# Patient Record
Sex: Female | Born: 1995 | Race: Black or African American | Hispanic: No | Marital: Single | Smoking: Never smoker
Health system: Southern US, Community
[De-identification: ages and names within clinical notes are randomized; demographics above are authoritative.]

## PROBLEM LIST (undated history)

## (undated) DIAGNOSIS — J45909 Unspecified asthma, uncomplicated: Secondary | ICD-10-CM

---

## 2018-02-14 ENCOUNTER — Encounter (HOSPITAL_COMMUNITY): Payer: Self-pay | Admitting: Emergency Medicine

## 2018-02-14 ENCOUNTER — Emergency Department (HOSPITAL_COMMUNITY)

## 2018-02-14 ENCOUNTER — Emergency Department (HOSPITAL_COMMUNITY)
Admission: EM | Admit: 2018-02-14 | Discharge: 2018-02-14 | Disposition: A | Discharge status: Home / Self Care | Attending: Emergency Medicine | Admitting: Emergency Medicine

## 2018-02-14 ENCOUNTER — Other Ambulatory Visit: Payer: Self-pay

## 2018-02-14 DIAGNOSIS — Y9344 Activity, trampolining: Secondary | ICD-10-CM | POA: Diagnosis not present

## 2018-02-14 DIAGNOSIS — Y9289 Other specified places as the place of occurrence of the external cause: Secondary | ICD-10-CM | POA: Insufficient documentation

## 2018-02-14 DIAGNOSIS — Y33XXXA Other specified events, undetermined intent, initial encounter: Secondary | ICD-10-CM | POA: Diagnosis not present

## 2018-02-14 DIAGNOSIS — J45909 Unspecified asthma, uncomplicated: Secondary | ICD-10-CM | POA: Diagnosis not present

## 2018-02-14 DIAGNOSIS — S83422A Sprain of lateral collateral ligament of left knee, initial encounter: Secondary | ICD-10-CM | POA: Diagnosis not present

## 2018-02-14 DIAGNOSIS — Y998 Other external cause status: Secondary | ICD-10-CM | POA: Diagnosis not present

## 2018-02-14 DIAGNOSIS — S8991XA Unspecified injury of right lower leg, initial encounter: Secondary | ICD-10-CM | POA: Diagnosis present

## 2018-02-14 HISTORY — DX: Unspecified asthma, uncomplicated: J45.909

## 2018-02-14 MED ORDER — MELOXICAM 15 MG PO TABS: 15.0000 mg | ORAL_TABLET | Freq: Every day | ORAL | 0 refills | Status: AC | PRN

## 2018-02-14 NOTE — ED Notes (Signed)
Took 200 mg Ibuprofen x10 minutes ago.

## 2018-02-14 NOTE — Discharge Instructions (Addendum)
Contact a health care provider if: You continue to have pain and swelling for 2-4 weeks. Your knee feels unstable or gives way.

## 2018-02-14 NOTE — ED Notes (Signed)
Ice pack given to patient.

## 2018-02-14 NOTE — ED Provider Notes (Signed)
[facility]-EMERGENCY DEPT Provider Note   CSN: [id number] Arrival date & time: 02/14/18  1947     History   Chief Complaint Chief Complaint  Patient presents with  . Knee Injury    HPI Natalie Chang is a 22 y.o. female.  Who presents the emergency department chief complaint of knee injury.  Patient was at the trampoline park when she came down onto her left leg.  She felt her knee pop to the side laterally and heard a popping sound.  She had immediate severe pain and was unable to bear weight on the leg.  She denies any numbness or tingling.  She feels pain even when she lifts her leg at the hip and has dead weight on the knee.  She is no previous history of injuries to the left knee.  She has no other injuries.  She did not hit her head or lose consciousness  HPI  Past Medical History:  Diagnosis Date  . Asthma     There are no active problems to display for this patient.   History reviewed. No pertinent surgical history.   OB History   None      Home Medications    Prior to Admission medications   Not on File    Family History History reviewed. No pertinent family history.  Social History Social History   Tobacco Use  . Smoking status: Never Smoker  . Smokeless tobacco: Never Used  Substance Use Topics  . Alcohol use: Not Currently  . Drug use: Not Currently     Allergies   Patient has no known allergies.   Review of Systems Review of Systems Ten systems reviewed and are negative for acute change, except as noted in the HPI.    Physical Exam Updated Vital Signs BP (!) 138/95 (BP Location: Right Arm)   Pulse 90   Temp 99.1 F (37.3 C) (Oral)   Resp 16   Ht 5\' 8"  (1.727 m)   Wt 94.3 kg   LMP 02/07/2018 (Exact Date)   SpO2 100%   BMI 31.63 kg/m   Physical Exam  Constitutional: She is oriented to person, place, and time. She appears well-developed and well-nourished. No distress.  HENT:  Head: Normocephalic and  atraumatic.  Eyes: Conjunctivae are normal. No scleral icterus.  Neck: Normal range of motion.  Cardiovascular: Normal rate, regular rhythm and normal heart sounds. Exam reveals no gallop and no friction rub.  No murmur heard. Pulmonary/Chest: Effort normal and breath sounds normal. No respiratory distress.  Abdominal: Soft. Bowel sounds are normal. She exhibits no distension and no mass. There is no tenderness. There is no guarding.  Musculoskeletal:  Left knee with minimal swelling, tender palpation along the lateral collateral ligament.  She has some mild opening of the joint on the lateral side.  The rest of the ligaments appear stable with negative anterior posterior tears drawer sign.  No medial joint line tenderness.  Patient has minimal range of motion of the knee with pain.  Normal pulses in the bilateral lower extremities, normal ipsilateral hip and ankle examination.  Neurological: She is alert and oriented to person, place, and time.  Skin: Skin is warm and dry. She is not diaphoretic.  Psychiatric: Her behavior is normal.  Nursing note and vitals reviewed.    ED Treatments / Results  Labs (all labs ordered are listed, but only abnormal results are displayed) Labs Reviewed - No data to display  EKG None  Radiology Dg  Knee Complete 4 Views Left  Result Date: 02/14/2018 CLINICAL DATA:  Injury EXAM: LEFT KNEE - COMPLETE 4+ VIEW COMPARISON:  None. FINDINGS: No evidence of fracture, dislocation, or joint effusion. No evidence of arthropathy or other focal bone abnormality. Soft tissues are unremarkable. IMPRESSION: Negative. Electronically Signed   By: Jasmine PangKim  Fujinaga M.D.   On: 02/14/2018 20:29    Procedures Procedures (including critical care time)  Medications Ordered in ED Medications - No data to display   Initial Impression / Assessment and Plan / ED Course  I have reviewed the triage vital signs and the nursing notes.  Pertinent labs & imaging results that were  available during my care of the patient were reviewed by me and considered in my medical decision making (see chart for details).     Patient X-Ray negative for obvious fracture or dislocation. Pain managed in ED. Pt advised to follow up with orthopedics if symptoms persist for possibility of missed fracture diagnosis. Patient given brace while in ED, conservative therapy recommended and discussed. Patient will be dc home & is agreeable with above plan.   Final Clinical Impressions(s) / ED Diagnoses   Final diagnoses:  Sprain of lateral collateral ligament of left knee, initial encounter    ED Discharge Orders    None       Arthor CaptainHarris, Maurico Perrell, PA-C 02/14/18 2136    Loren RacerYelverton, David, MD 02/14/18 2231

## 2018-02-14 NOTE — ED Triage Notes (Signed)
Pt arriving POV with left knee injury. Pt reports she was at the trampoline park felt her left knee "snap" and then she fell down.

## 2018-02-14 NOTE — ED Notes (Signed)
AVS explained in detail. School note given and note to address patient's transportation needs from class to class. Knows to take medication as prescribed and rest/ice/elevate knee. Knows appropriate use of knee immobilizer. In place upon discharge. Taken in wheelchair to care. No other c/c. Given verbal consent to discharge instead of signing.

## 2018-02-21 ENCOUNTER — Ambulatory Visit (INDEPENDENT_AMBULATORY_CARE_PROVIDER_SITE_OTHER): Admitting: Orthopaedic Surgery

## 2018-02-21 ENCOUNTER — Encounter (INDEPENDENT_AMBULATORY_CARE_PROVIDER_SITE_OTHER): Payer: Self-pay | Admitting: Orthopaedic Surgery

## 2018-02-21 VITALS — BP 119/73 | HR 60 | Ht 68.0 in | Wt 208.0 lb

## 2018-02-21 DIAGNOSIS — M25062 Hemarthrosis, left knee: Secondary | ICD-10-CM | POA: Diagnosis not present

## 2018-02-21 DIAGNOSIS — S8392XA Sprain of unspecified site of left knee, initial encounter: Secondary | ICD-10-CM

## 2018-02-21 MED ORDER — LIDOCAINE HCL 1 % IJ SOLN
0.5000 mL | INTRAMUSCULAR | Status: AC | PRN
  Administered 2018-02-21: .5 mL

## 2018-02-21 NOTE — Addendum Note (Signed)
Addended by: Rogers SeedsYEATTS, Emer Onnen M on: 02/21/2018 04:54 PM   Modules accepted: Orders

## 2018-02-21 NOTE — Progress Notes (Addendum)
Office Visit Note   Patient: Natalie Chang           Date of Birth: 1995/08/31           MRN: [id number] Visit Date: 02/21/2018              Requested by: No referring provider defined for this encounter. PCP: Patient, No Pcp Per   Assessment & Plan: Visit Diagnoses:  1. Sprain of left knee, unspecified ligament, initial encounter   2. Knee hemarthrosis, left     Plan: Patient has had acute hemarthrosis after he knee injury.  She is been on crutches and use the knee immobilizer.  She needs MRI scan to rule out acute ACL tear versus peripheral meniscal tear with her acute hemarthrosis.  Office follow-up after scan for review. Follow-Up Instructions: Office follow-up post MRI left knee for acute knee injury with tense hemarthrosis.  Compressive knee sleeve given that she can use when she is out of the knee immobilizer.  Orders:  Orders Placed This Encounter  Procedures  . Large Joint Inj   No orders of the defined types were placed in this encounter.     Procedures: Large Joint Inj: L knee on 02/21/2018 3:21 PM Indications: joint swelling and pain Details: 22 G 1.5 in needle, anterolateral approach  Arthrogram: No  Medications: 0.5 mL lidocaine 1 % Aspirate: 40 mL bloody Outcome: tolerated well, no immediate complications Procedure, treatment alternatives, risks and benefits explained, specific risks discussed. Consent was given by the patient. Patient was prepped and draped in the usual sterile fashion.       Clinical Data: No additional findings.   Subjective: Chief Complaint  Patient presents with  . Left Knee - Pain    HPI 22 year old female was at a trampoline park when she injured her left leg.  States she had sharp pain laterally heard a pop and was not able to bear weight.  She is seen in the emergency room on 02/14/2018 where x-rays of her knee were negative for effusion or fracture.  Diagnosis was suspected sprain of the lateral collateral ligament of  the left knee.  No past history of knee injury.  Patient's mother is on face time on patient's phone giving additional history.  Patient is a Holiday representative at [employer].  Review of Systems positive for asthma.  Patient is a non-smoker.  14 point review of systems noncontributory as it pertains HPI 14 systems updated.   Objective: Vital Signs: BP 119/73   Pulse 60   Ht 5\' 8"  (1.727 m)   Wt 208 lb (94.3 kg)   LMP 02/07/2018 (Exact Date)   BMI 31.63 kg/m   Physical Exam  Constitutional: She is oriented to person, place, and time. She appears well-developed.  HENT:  Head: Normocephalic.  Right Ear: External ear normal.  Left Ear: External ear normal.  Eyes: Pupils are equal, round, and reactive to light.  Neck: No tracheal deviation present. No thyromegaly present.  Cardiovascular: Normal rate.  Pulmonary/Chest: Effort normal.  Abdominal: Soft.  Neurological: She is alert and oriented to person, place, and time.  Skin: Skin is warm and dry.  Psychiatric: She has a normal mood and affect. Her behavior is normal.    Ortho Exam patient has significant swelling left knee with likely hemarthrosis.  Collateral ligaments are stable she has some tenderness along the medial collateral ligament and over the MCL attachment of the femur without significant opening with valgus stress.  Some tenderness  at the lateral joint line 1+ anterior Lockman.  She can flex to 60 degrees limited by her hemarthrosis.  Patellar tracking in that range is normal no tenderness over the medial patellofemoral ligament.  Distal pulses are intact no ecchymosis is noted.   Specialty Comments:  No specialty comments available.  Imaging: No results found.   PMFS History: There are no active problems to display for this patient.  Past Medical History:  Diagnosis Date  . Asthma     No family history on file.  No past surgical history on file. Social History   Occupational History  . Not on file    Tobacco Use  . Smoking status: Never Smoker  . Smokeless tobacco: Never Used  Substance and Sexual Activity  . Alcohol use: Not Currently  . Drug use: Not Currently  . Sexual activity: Not on file

## 2018-02-22 ENCOUNTER — Telehealth (INDEPENDENT_AMBULATORY_CARE_PROVIDER_SITE_OTHER): Payer: Self-pay | Admitting: Orthopaedic Surgery

## 2018-02-22 NOTE — Telephone Encounter (Signed)
Ok for note 

## 2018-02-22 NOTE — Telephone Encounter (Signed)
Note emailed. Patient advised.

## 2018-02-22 NOTE — Telephone Encounter (Signed)
Yes OK. thanks

## 2018-02-22 NOTE — Telephone Encounter (Signed)
Patient called t get a note to excuse her from class today. She came in yesterday and had her knee aspirated  Today it is very swollen and she can hardly walk on it.  Please call patient to advise.  [phone]

## 2018-02-22 NOTE — Telephone Encounter (Signed)
Patient states shes already missed a couple out of the 5 classes she had today, she is wondering when she could expect this note if its approved? She is needing it as soon as possible and she would like to email her professors the note from Dr. Ophelia CharterYates so is requesting it be emailed to her at:  [email or web address]

## 2018-02-24 ENCOUNTER — Telehealth (INDEPENDENT_AMBULATORY_CARE_PROVIDER_SITE_OTHER): Payer: Self-pay | Admitting: Radiology

## 2018-02-24 NOTE — Telephone Encounter (Signed)
error 

## 2018-03-12 ENCOUNTER — Ambulatory Visit
Admission: RE | Admit: 2018-03-12 | Discharge: 2018-03-12 | Disposition: A | Discharge status: Home / Self Care | Source: Ambulatory Visit | Attending: Orthopaedic Surgery | Admitting: Orthopaedic Surgery

## 2018-03-12 DIAGNOSIS — S8392XA Sprain of unspecified site of left knee, initial encounter: Secondary | ICD-10-CM

## 2018-03-12 DIAGNOSIS — M25062 Hemarthrosis, left knee: Secondary | ICD-10-CM

## 2018-03-14 ENCOUNTER — Ambulatory Visit (INDEPENDENT_AMBULATORY_CARE_PROVIDER_SITE_OTHER): Admitting: Orthopaedic Surgery

## 2018-03-14 ENCOUNTER — Encounter (INDEPENDENT_AMBULATORY_CARE_PROVIDER_SITE_OTHER): Payer: Self-pay | Admitting: Orthopaedic Surgery

## 2018-03-14 VITALS — BP 116/68 | HR 68 | Ht 68.0 in | Wt 208.0 lb

## 2018-03-14 DIAGNOSIS — S83512D Sprain of anterior cruciate ligament of left knee, subsequent encounter: Secondary | ICD-10-CM | POA: Diagnosis not present

## 2018-03-14 NOTE — Progress Notes (Signed)
Office Visit Note   Patient: Natalie SleightBrionna Keahey           Date of Birth: 11/24/1995           MRN: [id number] Visit Date: 03/14/2018              Requested by: No referring provider defined for this encounter. PCP: Patient, No Pcp Per   Assessment & Plan: Visit Diagnoses:  1. Complete tear of anterior cruciate ligament of left knee, subsequent encounter     Plan: We long discussion concerning anterior cruciate ligament function.  She is 22 years of age and physically active with all different types of sports.  She maintains an active lifestyle and states she will need to have the cruciate ligament reconstructed.  We discussed operative technique overnight stay outpatient surgical center.  Questions were elicited and answered to the patient also her mother they understand and request to proceed.  She will work on her last few degrees of knee flexion and continue to work on the straight leg raising to maintain quad strength.  She and her mother understand and requests we proceed.  Decision for surgery made today, risks discussed including anesthesia, arthrofibrosis and  recurrent tear.  Follow-Up Instructions: No follow-ups on file.   Orders:  No orders of the defined types were placed in this encounter.  No orders of the defined types were placed in this encounter.     Procedures: No procedures performed   Clinical Data: No additional findings.   Subjective: Chief Complaint  Patient presents with  . Left Knee - Follow-up    MRI review    HPI 22 year old female returns post trampoline injury to her left knee.  She had tense effusion which was tapped an MRI scan has been obtained which shows complete tear of the ACL.  She has full extension she flexes down to 20 degrees but does not flex all the way with her heel to her buttocks that she does with her opposite knee.  She is noted instability if she tries to turn or twist and is been very careful to only walk straight ahead.  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  She normally is active with outdoor activities hiking, different types of outdoor sports.  Today she has her mother on speaker phone for the discussion and also face time for the discussion. Review of Systems non-smoker healthy no previous surgeries no previous knee injuries prior to the trampoline injury 02/14/2018.   Objective: Vital Signs: BP 116/68   Pulse 68   Ht 5\' 8"  (1.727 m)   Wt 208 lb (94.3 kg)   BMI 31.63 kg/m   Physical Exam  Constitutional: She is oriented to person, place, and time. She appears well-developed.  HENT:  Head: Normocephalic.  Right Ear: External ear normal.  Left Ear: External ear normal.  Eyes: Pupils are equal, round, and reactive to light.  Neck: No tracheal deviation present. No thyromegaly present.  Cardiovascular: Normal rate.  Pulmonary/Chest: Effort normal.  Abdominal: Soft.  Neurological: She is alert and oriented to person, place, and time.  Skin: Skin is warm and dry.  Psychiatric: She has a normal mood and affect. Her behavior is normal.    Ortho Exam left knee range of motion 0 to 120 degrees.  Collateral ligaments are stable.  Positive Lockman positive pivot shift.  Distal pulses are intact no pain with hip range of motion.  Specialty  Comments:  No specialty comments available.  Imaging: No results found.   PMFS History: Patient Active Problem List   Diagnosis Date Noted  . Sprain of left knee 02/21/2018  . Knee hemarthrosis, left 02/21/2018   Past Medical History:  Diagnosis Date  . Asthma     No family history on file.  No past surgical history on file. Social History   Occupational History  . Not on file  Tobacco Use  . Smoking status: Never Smoker  . Smokeless tobacco: Never Used  Substance and Sexual Activity  . Alcohol use: Not Currently  . Drug use: Not Currently  . Sexual activity: Not on file

## 2018-03-17 ENCOUNTER — Telehealth (INDEPENDENT_AMBULATORY_CARE_PROVIDER_SITE_OTHER): Payer: Self-pay | Admitting: Orthopaedic Surgery

## 2018-03-17 NOTE — Telephone Encounter (Signed)
Note completed and emailed to patient at [email or web address] per patient request. Patient is not working on achilles, she is doing flexion and ROM with knee.

## 2018-03-17 NOTE — Telephone Encounter (Signed)
Ok for note 

## 2018-03-17 NOTE — Telephone Encounter (Signed)
Patient would like to know where we are in the process of scheduling surgery.  Please advise.

## 2018-03-17 NOTE — Telephone Encounter (Signed)
She is suppose to be working on knee ROM, no achilles stretching. Needs a little better knee flexion before being scheduled for ACL reconstruction. OK for note, call her and make sure she is working on knee flexion. thanks

## 2018-03-17 NOTE — Telephone Encounter (Signed)
Patient called stating that Dr. Ophelia CharterYates had instructed her to stretch and do exercises for her Achilles tendon, she thinks that she over did it and stayed home from school today and wanted to know if Dr. Ophelia CharterYates will write her a note excusing her from school.  CB#[phone].  Thank you.

## 2018-03-20 ENCOUNTER — Telehealth (INDEPENDENT_AMBULATORY_CARE_PROVIDER_SITE_OTHER): Payer: Self-pay | Admitting: Orthopaedic Surgery

## 2018-03-20 NOTE — Telephone Encounter (Signed)
Ok for note 

## 2018-03-20 NOTE — Telephone Encounter (Signed)
Patient turned 22 years of age on 09-10 of this month.  Her coverage was denied based on age, but she is eligible based on being a full time student at an accredited college seeking an associates degree or higher and her father provides 50% or more of her support. He has 90 days to update and extend coverage for the dependant.  Patient obtained a letter from her school's registrar office today and was going to the [facility] [place] DEERS ( Defense Enrollment Eligibility Reporting System) office, but they would require an appointment, yet not available until October. .  She was advised to use the [place] office (where her dad is located) and this could be expedited at that location.  She states that they are aware of her surgery scheduled on 03-27-18. She will call me tomorrow

## 2018-03-20 NOTE — Telephone Encounter (Signed)
OK to fix note that she is being scheduled for knee reconstruction surgery on the 30th and may miss some classes due to knee pain .  Thank you

## 2018-03-20 NOTE — Telephone Encounter (Signed)
Note emailed 

## 2018-03-20 NOTE — Telephone Encounter (Signed)
Patient requesting to be on bed rest for the rest of the week until her surgery, she would need a school note emailed to her: [email or web address]  Patients # [phone]

## 2018-03-23 ENCOUNTER — Telehealth (INDEPENDENT_AMBULATORY_CARE_PROVIDER_SITE_OTHER): Payer: Self-pay | Admitting: Orthopaedic Surgery

## 2018-03-23 NOTE — Telephone Encounter (Signed)
Please see below.

## 2018-03-23 NOTE — Telephone Encounter (Signed)
[facility]  Would like to know what type of screws are needed for patients ACL reconstruction/patella tendon autograft that is scheduled this Monday September 30th.

## 2018-03-23 NOTE — Telephone Encounter (Signed)
Please advise 

## 2018-03-23 NOTE — Telephone Encounter (Signed)
Metal screws see my card for ACL recon. thanks

## 2018-03-27 DIAGNOSIS — S83512A Sprain of anterior cruciate ligament of left knee, initial encounter: Secondary | ICD-10-CM

## 2018-03-27 NOTE — Telephone Encounter (Signed)
This has been resolved. See surgery referral.

## 2018-03-28 ENCOUNTER — Telehealth (INDEPENDENT_AMBULATORY_CARE_PROVIDER_SITE_OTHER): Payer: Self-pay

## 2018-03-28 NOTE — Telephone Encounter (Signed)
Please advise 

## 2018-03-28 NOTE — Telephone Encounter (Signed)
noted 

## 2018-03-28 NOTE — Telephone Encounter (Signed)
I called discussed. She wanted to go home and not stay overnight for pain meds and now regrets it. Elevation, ice , pain med not to exceed what I told her she could take. She will elevate higher. FYI

## 2018-03-28 NOTE — Telephone Encounter (Signed)
Pt is s/p an ACL recon from yesterday and states that she is having terrible pain and muscle  spasms despite taking percocet 10/3023 and robaxin 500 mg and is asking what else she can do.

## 2018-03-29 ENCOUNTER — Telehealth (INDEPENDENT_AMBULATORY_CARE_PROVIDER_SITE_OTHER): Payer: Self-pay | Admitting: Orthopaedic Surgery

## 2018-03-29 NOTE — Telephone Encounter (Signed)
UCALL. Her percocet is stronger. norco is weaker.

## 2018-03-29 NOTE — Telephone Encounter (Signed)
Noted. Duplicate message in chart. 

## 2018-03-29 NOTE — Telephone Encounter (Signed)
Patient called needing to know when to change the dressing on her left knee. The number to contact patient is [phone]

## 2018-03-29 NOTE — Telephone Encounter (Signed)
I called and advised leave dressing on until follow up in office.

## 2018-03-29 NOTE — Telephone Encounter (Signed)
Please advise 

## 2018-03-29 NOTE — Telephone Encounter (Signed)
I called and spoke with patient and advised. She would like to know if there is something that she can take for breakthrough pain as the percocet only helps x 2 hours before wearing off.  Please advise.

## 2018-03-29 NOTE — Telephone Encounter (Signed)
She can add ibuprofen 800mg  po bid with food which will help.

## 2018-03-29 NOTE — Telephone Encounter (Signed)
Patient called requesting that her Percocet be changed to Vicodin.  I did not see Percocet listed as a medication for her.  CB#[phone].  Thank you.

## 2018-03-29 NOTE — Telephone Encounter (Signed)
I called patient and advised. 

## 2018-03-29 NOTE — Telephone Encounter (Signed)
Patient returning call back to Dr. Ophelia Charter? Asking for a return call # [phone]

## 2018-04-03 ENCOUNTER — Telehealth (INDEPENDENT_AMBULATORY_CARE_PROVIDER_SITE_OTHER): Payer: Self-pay | Admitting: Orthopaedic Surgery

## 2018-04-03 NOTE — Telephone Encounter (Signed)
Patient requesting rx refill on percocet, she said she has really bad pain right side of left leg nd   CB # [phone]

## 2018-04-04 ENCOUNTER — Ambulatory Visit (INDEPENDENT_AMBULATORY_CARE_PROVIDER_SITE_OTHER): Admitting: Orthopaedic Surgery

## 2018-04-04 ENCOUNTER — Encounter (INDEPENDENT_AMBULATORY_CARE_PROVIDER_SITE_OTHER): Payer: Self-pay | Admitting: Orthopaedic Surgery

## 2018-04-04 VITALS — BP 138/85 | HR 120

## 2018-04-04 DIAGNOSIS — S83512D Sprain of anterior cruciate ligament of left knee, subsequent encounter: Secondary | ICD-10-CM

## 2018-04-04 MED ORDER — HYDROCODONE-ACETAMINOPHEN 5-325 MG PO TABS: 1.0000 | ORAL_TABLET | Freq: Four times a day (QID) | ORAL | 0 refills | Status: AC | PRN

## 2018-04-04 NOTE — Telephone Encounter (Signed)
fyi  Patient has follow up appointment this afternoon. She called the office crying this morning in pain. I did tell her she could come in at the beginning of clinic (12:45) instead of waiting until 2:30 appointment time.

## 2018-04-04 NOTE — Progress Notes (Signed)
   Post-Op Visit Note   Patient: Natalie Chang           Date of Birth: 01/15/1996           MRN: [id number] Visit Date: 04/04/2018 PCP: Patient, No Pcp Per   Assessment & Plan: Follow-up left knee ACL reconstruction.  Steri-Strips changed she is concerned about keloids and has had keloid problems in the past.  Incision looks good.  We discussed firing her quad which she is having some trouble doing working on gentle knee flexion using her opposite foot hooked underneath her ankle.  I plan to recheck her in 2 weeks.  Outpatient therapy written for ACL reconstruction exercises. Steri strips change, pt emotional and crying.  Chief Complaint:  Chief Complaint  Patient presents with  . Left Knee - Routine Post Op   Visit Diagnoses:  1. Complete tear of anterior cruciate ligament of left knee, subsequent encounter     Plan: Get in the shower wash of her leg carefully.  She will work on firing her quad working on gentle knee flexion.  Recheck 2 weeks.  Therapy prescription given.discussed with her Mother , questions were answered.   Follow-Up Instructions: Return in about 2 weeks (around 04/18/2018).   Orders:  Orders Placed This Encounter  Procedures  . Ambulatory referral to Physical Therapy   Meds ordered this encounter  Medications  . HYDROcodone-acetaminophen (NORCO) 5-325 MG tablet    Sig: Take 1-2 tablets by mouth every 6 (six) hours as needed for moderate pain.    Dispense:  30 tablet    Refill:  0    Imaging: No results found.  PMFS History: Patient Active Problem List   Diagnosis Date Noted  . Sprain of left knee 02/21/2018  . Knee hemarthrosis, left 02/21/2018   Past Medical History:  Diagnosis Date  . Asthma     History reviewed. No pertinent family history.  History reviewed. No pertinent surgical history. Social History   Occupational History  . Not on file  Tobacco Use  . Smoking status: Never Smoker  . Smokeless tobacco: Never Used  Substance and  Sexual Activity  . Alcohol use: Not Currently  . Drug use: Not Currently  . Sexual activity: Not on file

## 2018-04-10 ENCOUNTER — Other Ambulatory Visit: Payer: Self-pay

## 2018-04-10 ENCOUNTER — Ambulatory Visit: Attending: Orthopaedic Surgery

## 2018-04-10 DIAGNOSIS — M6281 Muscle weakness (generalized): Secondary | ICD-10-CM | POA: Diagnosis present

## 2018-04-10 DIAGNOSIS — M25562 Pain in left knee: Secondary | ICD-10-CM | POA: Diagnosis not present

## 2018-04-10 DIAGNOSIS — M25662 Stiffness of left knee, not elsewhere classified: Secondary | ICD-10-CM | POA: Diagnosis present

## 2018-04-10 DIAGNOSIS — R262 Difficulty in walking, not elsewhere classified: Secondary | ICD-10-CM

## 2018-04-10 DIAGNOSIS — R600 Localized edema: Secondary | ICD-10-CM | POA: Diagnosis present

## 2018-04-10 NOTE — Patient Instructions (Signed)
Sitting QS and SLR with encouragement to incr contraction even if incr pain but to do for shorter duration and reps  And to elevate more with swelling

## 2018-04-10 NOTE — Therapy (Signed)
[facility] [address] [place], [place], [address] Phone: [phone]   Fax:  [phone]  Physical Therapy Evaluation  Patient Details  Name: Natalie Chang MRN: [id number] Date of Birth: [birth year] Referring Provider (PT): Annell Greening, MD   Encounter Date: 04/10/2018  PT End of Session - 04/10/18 1231    Visit Number  1    Number of Visits  32    Date for PT Re-Evaluation  07/28/18    Authorization Type  [employer]    PT Start Time  1230    PT Stop Time  1317    PT Time Calculation (min)  47 min    Activity Tolerance  Patient tolerated treatment well;Patient limited by pain    Behavior During Therapy  [facility] for tasks assessed/performed       Past Medical History:  Diagnosis Date  . Asthma     No past surgical history on file.  There were no vitals filed for this visit.   Subjective Assessment - 04/10/18 1244    Subjective  She was jumping on trampoline and felt pop in  LT knee now post LT ACl reconstruction with hamstring. She is doing flexion and extension ROm and fire quad.  doing 4-6 sessions     Limitations  Walking;Standing;House hold activities    How long can you sit comfortably?  10-15 min with knee ext       How long can you stand comfortably?  10 min     How long can you walk comfortably?  4 blocks    Patient Stated Goals  She wants to regain ROM and decr swelling    Currently in Pain?  Yes    Pain Score  2    at rest   Pain Location  Knee    Pain Orientation  Left;Anterior;Lateral    Pain Descriptors / Indicators  Aching;Sharp    Pain Type  Surgical pain    Pain Onset  1 to 4 weeks ago    Pain Frequency  Constant    Aggravating Factors   moving leg, walking     Pain Relieving Factors  rest, meds, cold          [facility] PT Assessment - 04/10/18 0001      Assessment   Medical Diagnosis  LT ACL repair    Referring Provider (PT)  Annell Greening, MD    Onset Date/Surgical Date  03/27/18   repair   Next  MD Visit  About 2 weeks    Prior Therapy  Not for this      Precautions   Precautions  Knee    Precaution Comments  ACL protocol    Required Braces or Orthoses  Knee Immobilizer - Left      Restrictions   Weight Bearing Restrictions  No      Balance Screen   Has the patient fallen in the past 6 months  Yes    How many times?  1    Has the patient had a decrease in activity level because of a fear of falling?   Yes    Is the patient reluctant to leave their home because of a fear of falling?   No      Home Environment   Living Environment  Private residence    Living Arrangements  Non-relatives/Friends    Available Help at Discharge  Friend(s)    Type of Home  Apartment    Home Access  Level entry  Home Layout  One level      Prior Function   Level of Independence  Needs assistance with ADLs;Needs assistance with homemaking;Requires assistive device for independence      Cognition   Overall Cognitive Status  Within Functional Limits for tasks assessed      Observation/Other Assessments   Focus on Therapeutic Outcomes (FOTO)   56% limited      ROM / Strength   AROM / PROM / Strength  AROM;PROM;Strength      AROM   AROM Assessment Site  Knee    Right/Left Knee  Right;Left    Right Knee Extension  0    Right Knee Flexion  140    Left Knee Extension  -25    Left Knee Flexion  45      PROM   PROM Assessment Site  Knee    Right/Left Knee  Left    Left Knee Extension  -10    Left Knee Flexion  85   sitting     Strength   Strength Assessment Site  Knee    Right/Left Knee  Right;Left    Right Knee Flexion  5/5    Right Knee Extension  5/5    Left Knee Flexion  --   not tested   Left Knee Extension  3-/5      Palpation   Patella mobility  mild decr ROm, tender with palpation      Ambulation/Gait   Gait Comments  Using single crutch with flexed LT knee and decr weight bearing                Objective measurements completed on examination: See above  findings.              PT Education - 04/10/18 1249    Education Details  POC , HEP , incr elevation with icing    Person(s) Educated  Patient    Methods  Explanation    Comprehension  Verbalized understanding       PT Short Term Goals - 04/10/18 1325      PT SHORT TERM GOAL #1   Title  She will be independent with initial HEp    Time  4    Period  Weeks    Status  New      PT SHORT TERM GOAL #2   Title  Active LT knee flexion willl be 125 degrees    Time  4    Period  Weeks    Status  New      PT SHORT TERM GOAL #3   Title  She will have -5 degrees extension at worst    Time  4    Period  Weeks    Status  New      PT SHORT TERM GOAL #4   Title  She will demo excellant quad contraction to improve stability with wlaking    Time  4    Period  Weeks    Status  New      PT SHORT TERM GOAL #5   Title  She will walk without device in and out of home        PT Long Term Goals - 04/10/18 1327      PT LONG TERM GOAL #1   Title  She will be independent with all HEP    Time  16    Period  Weeks    Status  New      PT  LONG TERM GOAL #2   Title  She will have full LT knee ROM and able to walk for all actoivity with 1-2 max pain    Time  16    Period  Weeks    Status  New      PT LONG TERM GOAL #3   Title  Her pain will be intermittant.     Time  16    Period  Weeks    Status  New      PT LONG TERM GOAL #4   Title  She will have minimal to no swelling with normal activity    Time  16    Period  Weeks    Status  New      PT LONG TERM GOAL #5   Title  She will be able to squat fully    Time  16    Period  Weeks    Status  New      Additional Long Term Goals   Additional Long Term Goals  Yes      PT LONG TERM GOAL #6   Title  She will be able to sit knee 90 degrees or more with no pain    Time  16    Period  Weeks    Status  New      PT LONG TERM GOAL #7   Title  FOTO score improve to 35% limited or better     Time  16    Period  Weeks     Status  New             Plan - 04/10/18 1320    Clinical Impression Statement  Ms [place] is progressing slowly post surgery and she reports significnat incr swelling limiting ROm compared to prior to surgery.  She demo weakness in quads and stiffness in flexion. She was able to improve quad contraction but with incr pain.   Her SLR was better in sitting.   We need to measure swelling next session.   She will do well in long run facilitated by PT.      Clinical Presentation  Stable    Clinical Decision Making  Low    Rehab Potential  Good    PT Frequency  2x / week    PT Duration  Other (comment)   16 weeks   PT Treatment/Interventions  Cryotherapy;Vasopneumatic Device;Balance training;Therapeutic exercise;Therapeutic activities;Patient/family education;Manual techniques;Passive range of motion;Taping;Stair training;Gait training    PT Next Visit Plan  Assess stairs and edema. quad and hip strength,   manual for ROM  and pain.   edema taping.     PT Home Exercise Plan  QS , SLR sitting    Consulted and Agree with Plan of Care  Patient       Patient will benefit from skilled therapeutic intervention in order to improve the following deficits and impairments:  Pain, Decreased activity tolerance, Decreased endurance, Decreased range of motion, Decreased strength, Difficulty walking, Decreased balance  Visit Diagnosis: Acute pain of left knee - Plan: PT plan of care cert/re-cert  Localized edema - Plan: PT plan of care cert/re-cert  Muscle weakness (generalized) - Plan: PT plan of care cert/re-cert  Difficulty in walking, not elsewhere classified - Plan: PT plan of care cert/re-cert  Stiffness of left knee, not elsewhere classified - Plan: PT plan of care cert/re-cert     Problem List Patient Active Problem List   Diagnosis Date Noted  . Sprain of left  knee 02/21/2018  . Knee hemarthrosis, left 02/21/2018    Caprice Red PT 04/10/2018, 1:37 PM  [facility] [address] [place], [place], [address] Phone: [phone]   Fax:  [phone]  Name: Natalie Chang MRN: [id number] Date of Birth: Oct 06, 1995

## 2018-04-18 ENCOUNTER — Ambulatory Visit

## 2018-04-18 ENCOUNTER — Ambulatory Visit (INDEPENDENT_AMBULATORY_CARE_PROVIDER_SITE_OTHER): Admitting: Orthopaedic Surgery

## 2018-04-18 ENCOUNTER — Telehealth: Payer: Self-pay | Admitting: Physical Therapy

## 2018-04-18 NOTE — Telephone Encounter (Signed)
Spoke to Natalie Chang and she forgot about appointment and stated she may need to cancel appointments as she does not have her insurance card  And she may need to wait to mid November. I asked her to call and cancel ahead of time if she is not coming to an appointment so we can schedule another patient.

## 2018-04-20 ENCOUNTER — Ambulatory Visit

## 2018-04-24 ENCOUNTER — Ambulatory Visit

## 2018-04-27 ENCOUNTER — Ambulatory Visit

## 2018-05-01 ENCOUNTER — Ambulatory Visit

## 2018-05-03 ENCOUNTER — Encounter: Admitting: Physical Therapy

## 2018-05-08 ENCOUNTER — Encounter (INDEPENDENT_AMBULATORY_CARE_PROVIDER_SITE_OTHER): Payer: Self-pay | Admitting: Orthopaedic Surgery

## 2018-05-08 ENCOUNTER — Encounter: Admitting: Physical Therapy

## 2018-05-08 ENCOUNTER — Ambulatory Visit (INDEPENDENT_AMBULATORY_CARE_PROVIDER_SITE_OTHER): Admitting: Orthopaedic Surgery

## 2018-05-08 VITALS — BP 115/76 | HR 73 | Ht 68.0 in | Wt 202.0 lb

## 2018-05-08 DIAGNOSIS — Z9889 Other specified postprocedural states: Secondary | ICD-10-CM

## 2018-05-08 NOTE — Progress Notes (Signed)
   Post-Op Visit Note   Patient: Natalie Chang           Date of Birth: [birth year]           MRN: [id number] Visit Date: 05/08/2018 PCP: Patient, No Pcp Per   Assessment & Plan: Post left knee ACL reconstruction 03/27/2018.  She has had a slight keloid formation without being significant raised.  It spread about 2 to 3 mm.  Chief Complaint:  Chief Complaint  Patient presents with  . Left Knee - Follow-up    03/27/18 Left Knee ACL Reconstruction   Visit Diagnoses: Post left knee ACL reconstruction.  Plan: Patient is starting therapy on the 15th.  There was some insurance card problems.  She is walking upstairs she has full extension she is flexing to 120 degrees.  Follow-Up Instructions: No follow-ups on file.   Orders:  No orders of the defined types were placed in this encounter.  No orders of the defined types were placed in this encounter.   Imaging: No results found.  PMFS History: Patient Active Problem List   Diagnosis Date Noted  . Sprain of left knee 02/21/2018  . Knee hemarthrosis, left 02/21/2018   Past Medical History:  Diagnosis Date  . Asthma     No family history on file.  No past surgical history on file. Social History   Occupational History  . Not on file  Tobacco Use  . Smoking status: Never Smoker  . Smokeless tobacco: Never Used  Substance and Sexual Activity  . Alcohol use: Not Currently  . Drug use: Not Currently  . Sexual activity: Not on file

## 2018-05-10 ENCOUNTER — Ambulatory Visit (INDEPENDENT_AMBULATORY_CARE_PROVIDER_SITE_OTHER): Admitting: Surgery

## 2018-05-10 ENCOUNTER — Encounter

## 2018-05-11 ENCOUNTER — Ambulatory Visit (INDEPENDENT_AMBULATORY_CARE_PROVIDER_SITE_OTHER): Payer: Self-pay

## 2018-05-11 ENCOUNTER — Ambulatory Visit (INDEPENDENT_AMBULATORY_CARE_PROVIDER_SITE_OTHER): Admitting: Surgery

## 2018-05-11 ENCOUNTER — Encounter (INDEPENDENT_AMBULATORY_CARE_PROVIDER_SITE_OTHER): Payer: Self-pay | Admitting: Surgery

## 2018-05-11 DIAGNOSIS — Z9889 Other specified postprocedural states: Secondary | ICD-10-CM

## 2018-05-11 NOTE — Progress Notes (Signed)
22 year old black female who is about a month status post left knee ACL construction returns.  Patient was seen by Dr. Ophelia CharterYates a few days ago and states that shortly after she fell causing pain and swelling in her knee.  Patient has not started formal PT yet and has not been wearing her knee immobilizer.  Exam Pleasant white female alert and oriented in no acute distress.  Left knee range of motion about 0 to 90 degrees.  Has obvious left quad atrophy.  ACL graft feels intact.   PCL and collaterals stable.  Knee is somewhat diffusely tender.  Calf nontender.  Neurovascular intact.   Plan X-ray left knee today looks good.  No acute changes.  Advised patient the importance of being compliant with going to formal PT.  Also recommended that she be in her knee immobilizer when she is up and ambulating until the physical therapist weans her out of this per protocol.  She states that she got rid of the knee immobilizer that she previously had and she was given a new one today.  I did advise her of the risk for reinjuring the knee which ultimately could tear her ACL graft requiring further surgery.  Follow-up with Dr. Ophelia CharterYates in 3 weeks.

## 2018-05-15 ENCOUNTER — Ambulatory Visit: Admitting: Physical Therapy

## 2018-05-18 ENCOUNTER — Ambulatory Visit: Attending: Orthopaedic Surgery | Admitting: Physical Therapy

## 2018-05-31 ENCOUNTER — Ambulatory Visit (INDEPENDENT_AMBULATORY_CARE_PROVIDER_SITE_OTHER): Admitting: Orthopaedic Surgery

## 2018-07-04 ENCOUNTER — Ambulatory Visit (INDEPENDENT_AMBULATORY_CARE_PROVIDER_SITE_OTHER): Admitting: Orthopaedic Surgery

## 2021-01-06 ENCOUNTER — Emergency Department (HOSPITAL_COMMUNITY)
Admission: EM | Admit: 2021-01-06 | Discharge: 2021-01-06 | Disposition: A | Discharge status: Home / Self Care | Payer: No Typology Code available for payment source | Attending: Student | Admitting: Student

## 2021-01-06 ENCOUNTER — Emergency Department (HOSPITAL_COMMUNITY): Payer: No Typology Code available for payment source

## 2021-01-06 ENCOUNTER — Encounter (HOSPITAL_COMMUNITY): Payer: Self-pay | Admitting: Emergency Medicine

## 2021-01-06 ENCOUNTER — Other Ambulatory Visit: Payer: Self-pay

## 2021-01-06 DIAGNOSIS — R072 Precordial pain: Secondary | ICD-10-CM | POA: Insufficient documentation

## 2021-01-06 DIAGNOSIS — R519 Headache, unspecified: Secondary | ICD-10-CM | POA: Diagnosis not present

## 2021-01-06 DIAGNOSIS — R6884 Jaw pain: Secondary | ICD-10-CM | POA: Diagnosis not present

## 2021-01-06 DIAGNOSIS — M79642 Pain in left hand: Secondary | ICD-10-CM | POA: Insufficient documentation

## 2021-01-06 DIAGNOSIS — Y92410 Unspecified street and highway as the place of occurrence of the external cause: Secondary | ICD-10-CM | POA: Diagnosis not present

## 2021-01-06 DIAGNOSIS — Z5321 Procedure and treatment not carried out due to patient leaving prior to being seen by health care provider: Secondary | ICD-10-CM | POA: Diagnosis not present

## 2021-01-06 LAB — CBC WITH DIFFERENTIAL/PLATELET
Abs Immature Granulocytes: 0.04 10*3/uL (ref 0.00–0.07)
Basophils Absolute: 0 10*3/uL (ref 0.0–0.1)
Basophils Relative: 0 %
Eosinophils Absolute: 0.1 10*3/uL (ref 0.0–0.5)
Eosinophils Relative: 1 %
HCT: 40.5 % (ref 36.0–46.0)
Hemoglobin: 13.5 g/dL (ref 12.0–15.0)
Immature Granulocytes: 0 %
Lymphocytes Relative: 24 %
Lymphs Abs: 2.8 10*3/uL (ref 0.7–4.0)
MCH: 30 pg (ref 26.0–34.0)
MCHC: 33.3 g/dL (ref 30.0–36.0)
MCV: 90 fL (ref 80.0–100.0)
Monocytes Absolute: 0.6 10*3/uL (ref 0.1–1.0)
Monocytes Relative: 6 %
Neutro Abs: 7.7 10*3/uL (ref 1.7–7.7)
Neutrophils Relative %: 69 %
Platelets: 293 10*3/uL (ref 150–400)
RBC: 4.5 MIL/uL (ref 3.87–5.11)
RDW: 12.7 % (ref 11.5–15.5)
WBC: 11.3 10*3/uL — ABNORMAL HIGH (ref 4.0–10.5)
nRBC: 0 % (ref 0.0–0.2)

## 2021-01-06 LAB — BASIC METABOLIC PANEL
Anion gap: 6 (ref 5–15)
BUN: 7 mg/dL (ref 6–20)
CO2: 25 mmol/L (ref 22–32)
Calcium: 9.4 mg/dL (ref 8.9–10.3)
Chloride: 108 mmol/L (ref 98–111)
Creatinine, Ser: 0.88 mg/dL (ref 0.44–1.00)
GFR, Estimated: >60 mL/min (ref >60)
Glucose, Bld: 81 mg/dL (ref 70–99)
Potassium: 3.6 mmol/L (ref 3.5–5.1)
Sodium: 139 mmol/L (ref 135–145)
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
## 2021-01-06 NOTE — ED Notes (Signed)
Pt left department.
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
